# Patient Record
Sex: Male | Born: 1985 | Race: Black or African American | Hispanic: No | Marital: Married | Smoking: Never smoker
Health system: Southern US, Community
[De-identification: ages and names within clinical notes are randomized; demographics above are authoritative.]

## PROBLEM LIST (undated history)

## (undated) DIAGNOSIS — J45909 Unspecified asthma, uncomplicated: Secondary | ICD-10-CM

## (undated) DIAGNOSIS — I10 Essential (primary) hypertension: Secondary | ICD-10-CM

---

## 2016-05-05 ENCOUNTER — Encounter (HOSPITAL_COMMUNITY): Payer: Self-pay | Admitting: Emergency Medicine

## 2016-05-05 ENCOUNTER — Ambulatory Visit (HOSPITAL_COMMUNITY)
Admission: EM | Admit: 2016-05-05 | Discharge: 2016-05-05 | Disposition: A | Discharge status: Home / Self Care | Payer: Worker's Compensation

## 2016-05-05 DIAGNOSIS — S61411A Laceration without foreign body of right hand, initial encounter: Secondary | ICD-10-CM

## 2016-05-05 HISTORY — DX: Essential (primary) hypertension: I10

## 2016-05-05 HISTORY — DX: Unspecified asthma, uncomplicated: J45.909

## 2016-05-05 NOTE — ED Provider Notes (Signed)
CSN: [id number]     Arrival date & time 05/05/16  1724 History   First MD Initiated Contact with Patient 05/05/16 1805     Chief Complaint  Patient presents with  . Laceration   (Consider location/radiation/quality/duration/timing/severity/associated sxs/prior Treatment)  Subjective:  Derrick Mitchell is a 31 y.o. male who presents for evaluation of a laceration to the base of the third finger on the right hand. Injury occurred 1 hour ago. The mechanism of the wound was patient was attempting to close a window and somehow cut his hand. The patient reports no numbness, no pain, no paresthesias in the affected area. There was not other injuries.  Patient denies any other symptoms. The tetanus status is up to date. The following portions of the patient's history were reviewed and updated as appropriate: allergies, current medications, past family history, past medical history, past social history, past surgical history and problem list.          Past Medical History:  Diagnosis Date  . Asthma   . Hypertension    History reviewed. No pertinent surgical history. History reviewed. No pertinent family history. Social History  Substance Use Topics  . Smoking status: Never Smoker  . Smokeless tobacco: Never Used  . Alcohol use Yes     Comment: occasional    Review of Systems  Skin: Positive for wound.    Allergies  Patient has no known allergies.  Home Medications   Prior to Admission medications   Medication Sig Start Date End Date Taking? Authorizing Provider  lisinopril (PRINIVIL,ZESTRIL) 20 MG tablet Take 20 mg by mouth daily.   Yes Historical Provider, MD  lisinopril-hydrochlorothiazide (PRINZIDE,ZESTORETIC) 20-25 MG tablet Take 1 tablet by mouth daily.   Yes Historical Provider, MD   Meds Ordered and Administered this Visit  Medications - No data to display  BP (!) 148/93 (BP Location: Right Arm)   Pulse 70   Temp 98.5 F (36.9 C) (Oral)   SpO2 97%  No data  found.   Physical Exam  Constitutional: He is oriented to person, place, and time. He appears well-developed and well-nourished.  Neck: Normal range of motion.  Cardiovascular: Normal rate and regular rhythm.   Pulmonary/Chest: Effort normal and breath sounds normal.  Musculoskeletal: Normal range of motion.  Neurological: He is alert and oriented to person, place, and time.  Skin: Skin is warm and dry.  There is a linear laceration measuring approximately 2 cm in length at the base of the third finger of the right Examination of the wound for foreign bodies and devitalized tissue showed none.  No neural or vascular damage noted.       Urgent Care Course     Procedures (including critical care time)  Labs Review Labs Reviewed - No data to display  Imaging Review No results found.   Visual Acuity Review  Right Eye Distance:   Left Eye Distance:   Bilateral Distance:    Right Eye Near:   Left Eye Near:    Bilateral Near:         MDM   1. Laceration of right hand without foreign body, initial encounter    Wound care discussed. Tetanus immunization not indicated.   Lurline IdolSamantha Graceann Boileau, FNP 05/05/16 1815

## 2016-05-05 NOTE — ED Triage Notes (Signed)
Pt was trying to open a window at school and the window would not open, but his hand slipped and he broke the window pane and cut his hand.

## 2019-12-07 ENCOUNTER — Emergency Department (HOSPITAL_COMMUNITY)
Admission: EM | Admit: 2019-12-07 | Discharge: 2019-12-08 | Disposition: A | Discharge status: Home / Self Care | Payer: BC Managed Care – PPO | Attending: Emergency Medicine | Admitting: Emergency Medicine

## 2019-12-07 ENCOUNTER — Other Ambulatory Visit: Payer: Self-pay

## 2019-12-07 ENCOUNTER — Emergency Department (HOSPITAL_COMMUNITY): Payer: BC Managed Care – PPO

## 2019-12-07 ENCOUNTER — Encounter (HOSPITAL_COMMUNITY): Payer: Self-pay | Admitting: Emergency Medicine

## 2019-12-07 DIAGNOSIS — W109XXA Fall (on) (from) unspecified stairs and steps, initial encounter: Secondary | ICD-10-CM | POA: Insufficient documentation

## 2019-12-07 DIAGNOSIS — R2232 Localized swelling, mass and lump, left upper limb: Secondary | ICD-10-CM | POA: Diagnosis not present

## 2019-12-07 DIAGNOSIS — Z5321 Procedure and treatment not carried out due to patient leaving prior to being seen by health care provider: Secondary | ICD-10-CM | POA: Insufficient documentation

## 2019-12-07 NOTE — ED Triage Notes (Signed)
Patient tripped and fell from stairs at home this evening , denies LOC/ambulatory , presents with left wrist swelling/skin intact.

## 2019-12-08 NOTE — ED Notes (Signed)
Pt called for vitals, no response. 

## 2021-11-29 IMAGING — CR DG WRIST COMPLETE 3+V*L*
4 series · 4 of 4 positions shown · non-contrast
Comparison: None.

CLINICAL DATA: Tripped and fell, swelling

EXAM:
LEFT WRIST - COMPLETE 3+ VIEW

[wrist pa]
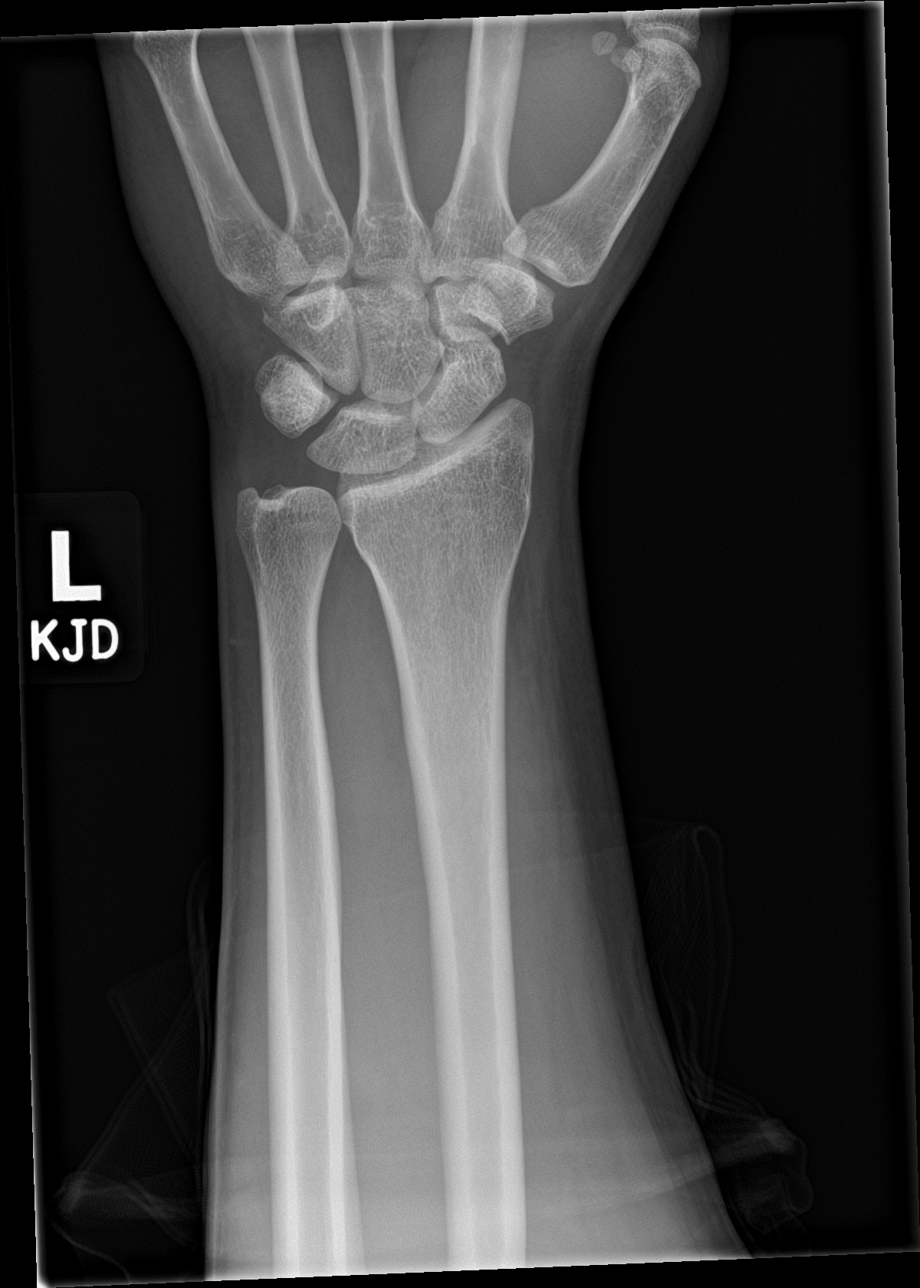

[wrist obl]
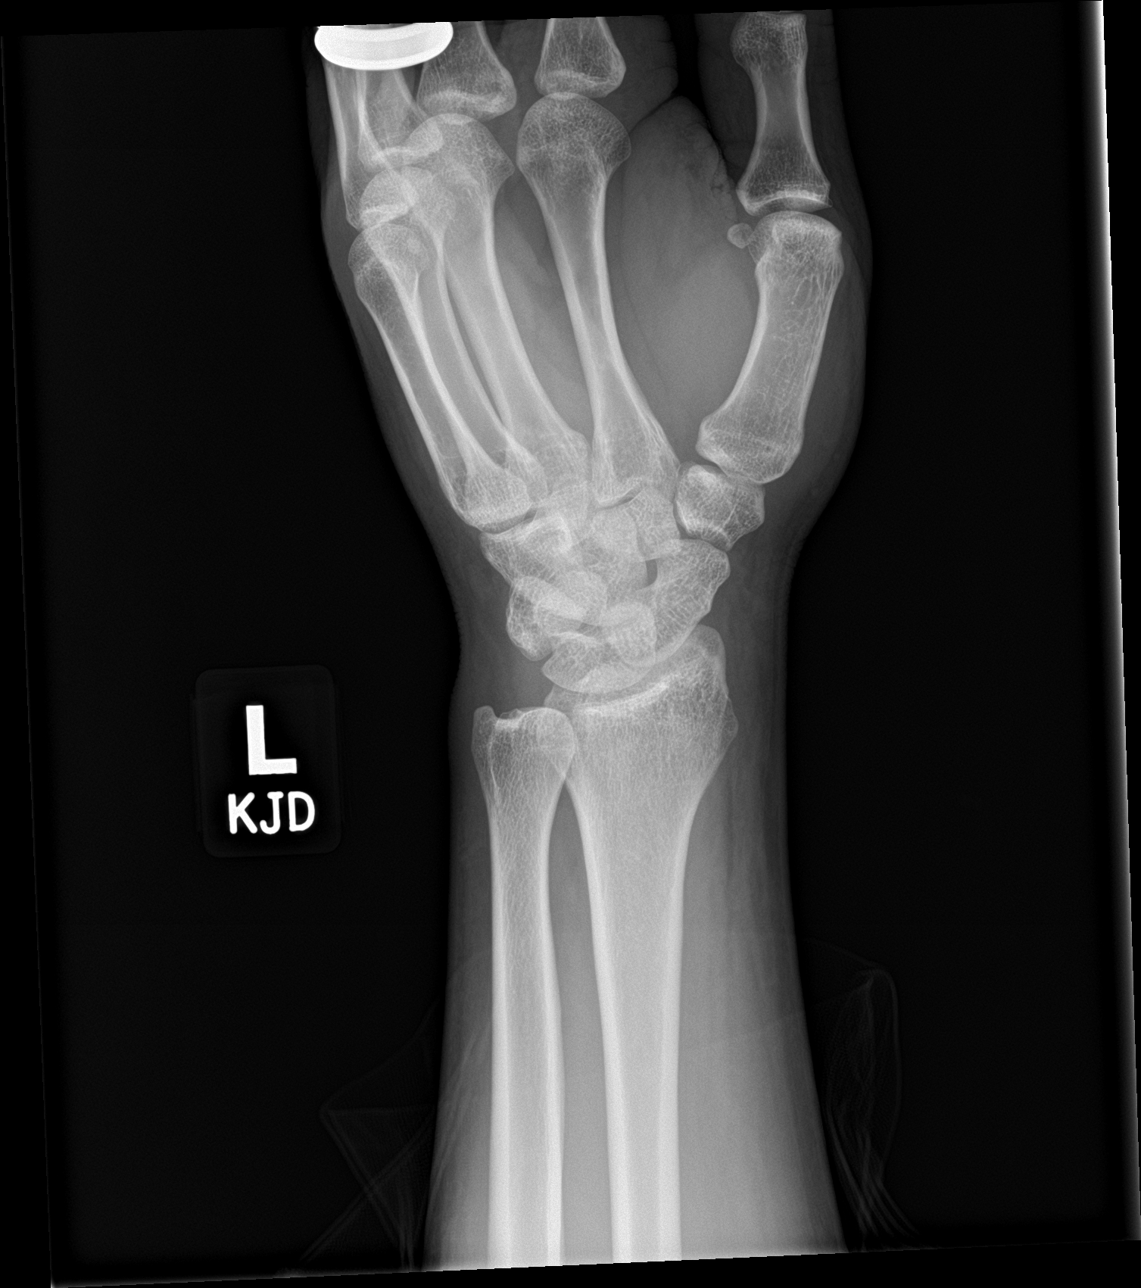

[wrist lat]
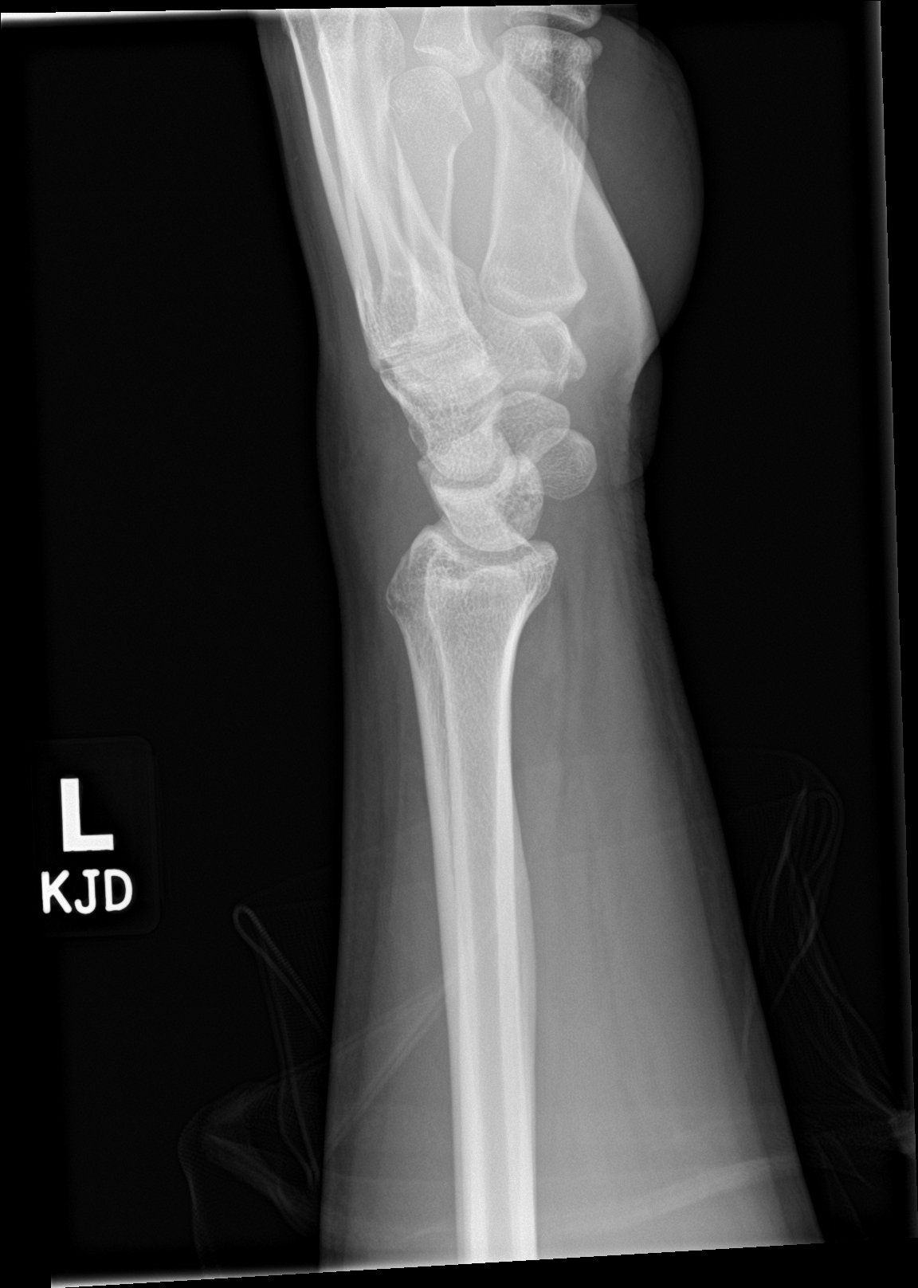

[wrist navicular]
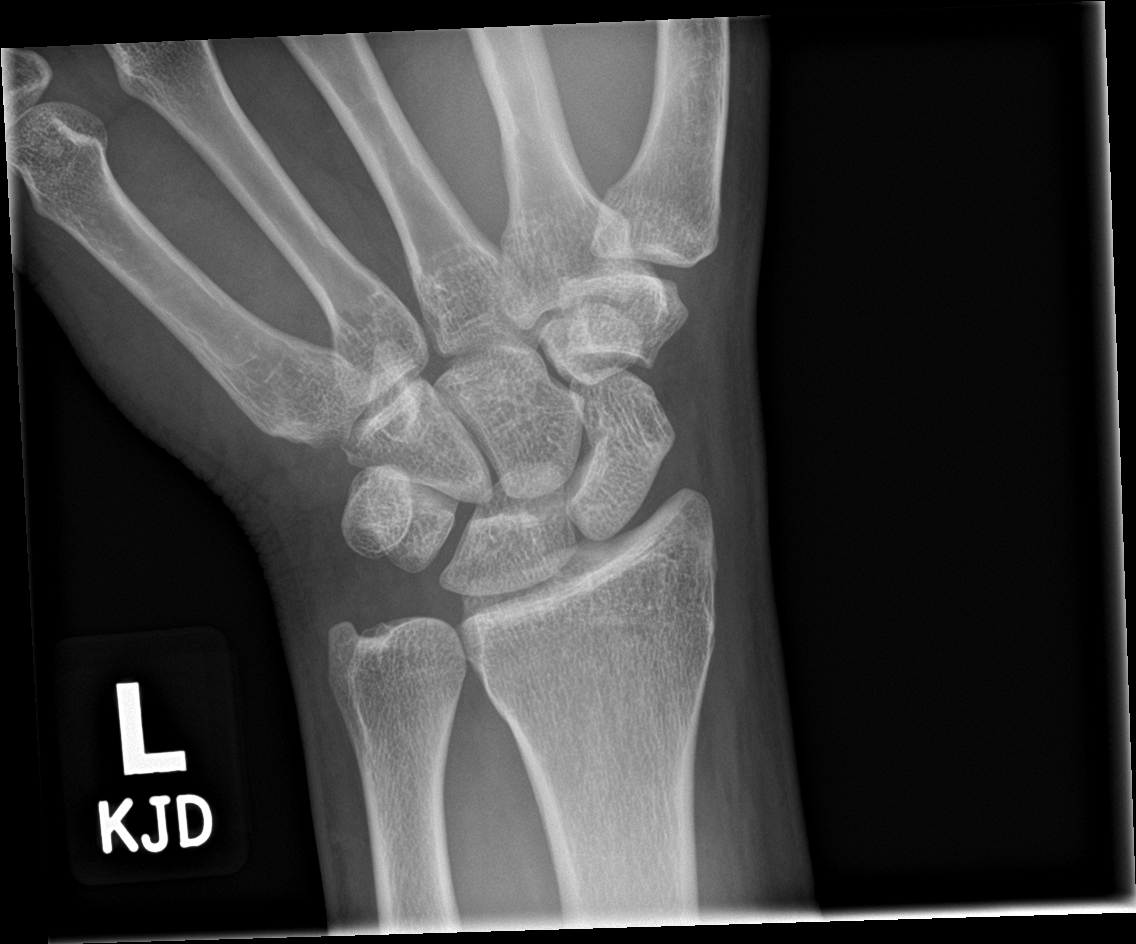

[4 of 4 positions shown; findings below may reference images not displayed]

FINDINGS: Frontal, oblique, lateral, and ulnar deviated views of the left
wrist are obtained. No fracture, subluxation, or dislocation. Joint
spaces are well preserved. Mild dorsal soft tissue swelling.
IMPRESSION: 1. Dorsal soft tissue swelling of the wrist.  No acute fracture.
